# Patient Record
Sex: Male | Born: 2005 | Race: White | Hispanic: No | Marital: Single | Smoking: Never smoker
Health system: Southern US, Community
[De-identification: ages and names within clinical notes are randomized; demographics above are authoritative.]

## PROBLEM LIST (undated history)

## (undated) ENCOUNTER — Ambulatory Visit (HOSPITAL_COMMUNITY): Admission: EM | Payer: Self-pay

## (undated) DIAGNOSIS — R17 Unspecified jaundice: Secondary | ICD-10-CM

## (undated) DIAGNOSIS — T7840XA Allergy, unspecified, initial encounter: Secondary | ICD-10-CM

## (undated) DIAGNOSIS — Q5522 Retractile testis: Secondary | ICD-10-CM

## (undated) DIAGNOSIS — H6690 Otitis media, unspecified, unspecified ear: Secondary | ICD-10-CM

## (undated) HISTORY — DX: Unspecified jaundice: R17

## (undated) HISTORY — DX: Retractile testis: Q55.22

## (undated) HISTORY — DX: Allergy, unspecified, initial encounter: T78.40XA

## (undated) HISTORY — DX: Otitis media, unspecified, unspecified ear: H66.90

---
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
## 2023-08-18 ENCOUNTER — Encounter: Payer: Self-pay | Admitting: Pediatrics

## 2024-05-03 ENCOUNTER — Other Ambulatory Visit: Payer: Self-pay | Admitting: Pediatrics

## 2024-07-19 ENCOUNTER — Other Ambulatory Visit: Payer: Self-pay | Admitting: Nurse Practitioner

## 2024-07-19 ENCOUNTER — Ambulatory Visit
Admission: RE | Admit: 2024-07-19 | Discharge: 2024-07-19 | Disposition: A | Discharge status: Home / Self Care | Payer: Worker's Compensation | Source: Ambulatory Visit | Attending: Nurse Practitioner | Admitting: Nurse Practitioner

## 2024-07-19 DIAGNOSIS — S61411A Laceration without foreign body of right hand, initial encounter: Secondary | ICD-10-CM
